# Patient Record
Sex: Male | Born: 1980 | Race: White | Hispanic: No | Marital: Single
Health system: Southern US, Community
[De-identification: ages and names within clinical notes are randomized; demographics above are authoritative.]

---

## 2020-12-31 ENCOUNTER — Emergency Department (HOSPITAL_COMMUNITY): Payer: No Typology Code available for payment source

## 2020-12-31 ENCOUNTER — Emergency Department (HOSPITAL_COMMUNITY)
Admission: EM | Admit: 2020-12-31 | Discharge: 2020-12-31 | Disposition: A | Discharge status: Home / Self Care | Payer: No Typology Code available for payment source | Attending: Emergency Medicine | Admitting: Emergency Medicine

## 2020-12-31 DIAGNOSIS — S7002XA Contusion of left hip, initial encounter: Secondary | ICD-10-CM | POA: Diagnosis not present

## 2020-12-31 DIAGNOSIS — S39012A Strain of muscle, fascia and tendon of lower back, initial encounter: Secondary | ICD-10-CM | POA: Diagnosis not present

## 2020-12-31 DIAGNOSIS — M542 Cervicalgia: Secondary | ICD-10-CM | POA: Diagnosis present

## 2020-12-31 DIAGNOSIS — S161XXA Strain of muscle, fascia and tendon at neck level, initial encounter: Secondary | ICD-10-CM | POA: Diagnosis not present

## 2020-12-31 MED ORDER — KETOROLAC TROMETHAMINE 30 MG/ML IJ SOLN
30.0000 mg | Freq: Once | INTRAMUSCULAR | Status: AC
  Administered 2020-12-31: 30 mg via INTRAMUSCULAR
  Filled 2020-12-31: qty 1

## 2020-12-31 MED ORDER — METHOCARBAMOL 500 MG PO TABS: 500.0000 mg | ORAL_TABLET | Freq: Two times a day (BID) | ORAL | 0 refills | Status: AC

## 2020-12-31 MED ORDER — IBUPROFEN 600 MG PO TABS: 600.0000 mg | ORAL_TABLET | Freq: Four times a day (QID) | ORAL | 0 refills | Status: AC | PRN

## 2020-12-31 NOTE — ED Notes (Signed)
DC instructions reviewed with pt. PT verbalized understanding. Pt DC °
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
